# Patient Record
Sex: Male | Born: 2000 | Race: White | Hispanic: No | Marital: Single | Smoking: Current every day smoker
Health system: Southern US, Community
[De-identification: ages and names within clinical notes are randomized; demographics above are authoritative.]

---
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
## 2022-04-07 ENCOUNTER — Ambulatory Visit: Payer: Managed Care, Other (non HMO) | Admitting: Family Medicine

## 2022-05-02 ENCOUNTER — Emergency Department (HOSPITAL_BASED_OUTPATIENT_CLINIC_OR_DEPARTMENT_OTHER)
Admission: EM | Admit: 2022-05-02 | Discharge: 2022-05-02 | Disposition: A | Discharge status: Home / Self Care | Payer: BC Managed Care – PPO | Attending: Emergency Medicine | Admitting: Emergency Medicine

## 2022-05-02 ENCOUNTER — Encounter (HOSPITAL_BASED_OUTPATIENT_CLINIC_OR_DEPARTMENT_OTHER): Payer: Self-pay

## 2022-05-02 ENCOUNTER — Other Ambulatory Visit: Payer: Self-pay

## 2022-05-02 ENCOUNTER — Emergency Department (HOSPITAL_BASED_OUTPATIENT_CLINIC_OR_DEPARTMENT_OTHER): Payer: BC Managed Care – PPO | Admitting: Radiology

## 2022-05-02 DIAGNOSIS — R0789 Other chest pain: Secondary | ICD-10-CM

## 2022-05-02 DIAGNOSIS — R079 Chest pain, unspecified: Secondary | ICD-10-CM | POA: Diagnosis not present

## 2022-05-02 NOTE — Discharge Instructions (Signed)
You are worked up in the ER for chest pain.  Your x-ray did not show signs of pneumonia or collapsed lung.  We talked about the small possibility that you may have experienced a spontaneous lung collapse, which reinflated, called a pneumothorax.  I included information for you to read about regarding this.  If your pain gets worse, you have difficulty breathing or taking in a breath, you become lightheaded or feel like passing out, please return immediately to the ER.  It is also possible you are experiencing inflammation of your chest wall, which is called costochondritis.  I would recommend avoiding chest muscle exercises for the next 1 to 2 weeks.  I would also recommend taking ibuprofen 600 mg every 6-8 hours as needed for pain for the next 7 days.  Please call to schedule follow-up appointment in your doctor's office at the end of the week for another recheck of your symptoms.

## 2022-05-02 NOTE — ED Provider Notes (Signed)
[facility] EMERGENCY DEPT Provider Note   CSN: [id number] Arrival date & time: 05/02/22  2155     History  Chief Complaint  Patient presents with   Chest Pain    Jaiceon Collister is a 21 y.o. male presented ED with complaint of left-sided chest pain.  He reports that he was relaxing at the pool around 9 PM this evening when he had a sudden pain in the left side of his chest.  It is located just lateral to his nipple.  It has been constant for 2 to 3 hours, worse with movement and inspiration.  He has never had this pain before.  HPI     Home Medications Prior to Admission medications   Not on File      Allergies    Patient has no known allergies.    Review of Systems   Review of Systems  Physical Exam Updated Vital Signs BP 128/72   Pulse 63   Temp 98.4 F (36.9 C) (Oral)   Resp 15   Ht 5\' 10"  (1.778 m)   Wt 73.9 kg   SpO2 99%   BMI 23.39 kg/m  Physical Exam Constitutional:      General: He is not in acute distress. HENT:     Head: Normocephalic and atraumatic.  Eyes:     Conjunctiva/sclera: Conjunctivae normal.     Pupils: Pupils are equal, round, and reactive to light.  Cardiovascular:     Rate and Rhythm: Normal rate and regular rhythm.  Pulmonary:     Effort: Pulmonary effort is normal. No respiratory distress.  Abdominal:     General: There is no distension.     Tenderness: There is no abdominal tenderness.  Musculoskeletal:     Comments: Tenderness of the intercostal ribs lateral to the left area  Skin:    General: Skin is warm and dry.  Neurological:     General: No focal deficit present.     Mental Status: He is alert. Mental status is at baseline.  Psychiatric:        Mood and Affect: Mood normal.        Behavior: Behavior normal.     ED Results / Procedures / Treatments   Labs (all labs ordered are listed, but only abnormal results are displayed) Labs Reviewed - No data to display  EKG EKG  Interpretation  Date/Time:  Tuesday May 02 2022 22:01:53 EDT Ventricular Rate:  73 PR Interval:  156 QRS Duration: 92 QT Interval:  374 QTC Calculation: 412 R Axis:   96 Text Interpretation: Normal sinus rhythm Rightward axis Borderline ECG No previous ECGs available Confirmed by 07[birth year]1983 [phone]) on 05/02/2022 10:24:58 PM  Radiology DG Chest 2 View  Result Date: 05/02/2022 CLINICAL DATA:  Chest pain. EXAM: CHEST - 2 VIEW COMPARISON:  January 08, 2006 FINDINGS: The heart size and mediastinal contours are within normal limits. Both lungs are clear. The visualized skeletal structures are unremarkable. IMPRESSION: No active cardiopulmonary disease. Electronically Signed   By: January 10, 2006 M.D.   On: 05/02/2022 22:45    Procedures Procedures    Medications Ordered in ED Medications - No data to display  ED Course/ Medical Decision Making/ A&P                           Medical Decision Making Amount and/or Complexity of Data Reviewed Radiology: ordered.   Differential diagnosis for the patient's symptoms would include costochondritis versus  pneumothorax versus occult rib fracture versus precordial catch versus other  I personally reviewed and interpreted his EKG on arrival, which shows a normal sinus rhythm, I have a very low clinical suspicion for ACS related dissection based on his clinical presentation.  This is also not consistent with acute myocarditis or pericarditis.  Patient's mother and father present at bedside.  X-rays personally reviewed and interpreted, showing no pneumothorax or pneumonia.  I suspect his pain may be costochondritis or precordial catch, and recommended NSAIDs for the next 7 days, and avoiding chest muscle exercises, as he does lift heavy weights at the gym.  I did discuss with the patient and his parents the small possibility that this was a spontaneous pneumothorax that had resolved, or may be too small to see on x-ray imaging, but would otherwise  be stable and not require any type of surgical intervention or chest tube.  Close return precautions were discussed for worsening pain, difficulty breathing, lightheadedness, loss of consciousness.  They all verbalized understanding.        Final Clinical Impression(s) / ED Diagnoses Final diagnoses:  Chest wall pain    Rx / DC Orders ED Discharge Orders     None         Terald Sleeper, MD 05/02/22 2339

## 2022-05-02 NOTE — ED Notes (Signed)
Reviewed AVS/discharge instruction with patient. Time allotted for and all questions answered. Patient is agreeable for d/c and escorted to ed exit by staff.  

## 2022-05-02 NOTE — ED Triage Notes (Signed)
Pt presents to the ED with left sided CP that started around 2100. Denies any associated symptoms. States that he was sitting watching fireworks when he started feeling a stabbing pain in the left side of his chest. Pt A&Ox4 at time of triage. VSS. EKG obtained

## 2022-09-17 DIAGNOSIS — J00 Acute nasopharyngitis [common cold]: Secondary | ICD-10-CM | POA: Diagnosis not present

## 2022-10-06 ENCOUNTER — Encounter: Payer: Self-pay | Admitting: Family Medicine

## 2022-10-06 ENCOUNTER — Ambulatory Visit (INDEPENDENT_AMBULATORY_CARE_PROVIDER_SITE_OTHER): Payer: BC Managed Care – PPO | Admitting: Family Medicine

## 2022-10-06 VITALS — BP 114/72 | HR 68 | Temp 97.8°F | Resp 16 | Ht 70.0 in | Wt 164.5 lb

## 2022-10-06 DIAGNOSIS — M62830 Muscle spasm of back: Secondary | ICD-10-CM

## 2022-10-06 MED ORDER — METHOCARBAMOL 500 MG PO TABS: 500.0000 mg | ORAL_TABLET | Freq: Three times a day (TID) | ORAL | 0 refills | Status: DC | PRN

## 2022-10-06 MED ORDER — MELOXICAM 15 MG PO TABS: 15.0000 mg | ORAL_TABLET | Freq: Every day | ORAL | 1 refills | Status: AC

## 2022-10-06 NOTE — Progress Notes (Signed)
   Subjective:    Patient ID: Edgar Walker, male    DOB: September 08, 2001, 21 y.o.   MRN: [id number]  HPI Back pain- pt reports back spasm x3 months.  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  Told him to ice and stretch.  Some relief w/ stretching hamstrings.  Pt reports low back pain that travels up spine to mid-back.  Improvement w/ ibuprofen and warm showers.  No radiation of pain down into butt or legs.     Review of Systems For ROS see HPI     Objective:   Physical Exam Vitals reviewed.  Constitutional:      General: He is not in acute distress.    Appearance: Normal appearance. He is not ill-appearing.  HENT:     Head: Normocephalic and atraumatic.  Musculoskeletal:        General: Tenderness (TTP over lumbar paraspinal muscles bilaterally w/ obvious spasm) present.  Skin:    General: Skin is warm and dry.     Findings: No rash.  Neurological:     General: No focal deficit present.     Mental Status: He is alert and oriented to person, place, and time.     Cranial Nerves: No cranial nerve deficit.     Sensory: No sensory deficit.     Motor: No weakness.     Gait: Gait normal.           Assessment & Plan:   Lumbar paraspinal muscle spasm- new.  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  Reports back pain and spasm x3 months.  Trainer at school suggested stretching hamstrings and ice baths.  Some improvement w/ Advil, massage, hot showers.  Palpable spasm on exam.  Start scheduled Meloxicam, add Methocarbamol prn, and refer to PT for evaluation, tx, and HEP.  Pt expressed understanding and is in agreement w/ plan.

## 2022-10-06 NOTE — Patient Instructions (Signed)
Follow up as needed or as scheduled START the once daily Meloxicam for pain and inflammation- take w/ food AVOID ibuprofen, Advil, Aleve while on the Meloxicam USE the Methocarbamol as needed for spasms- may cause drowsiness HEAT!!! We'll call you to set up your physical therapy appt Massage if possible Call with any questions or concerns Hang in there!!!

## 2022-10-12 DIAGNOSIS — M5451 Vertebrogenic low back pain: Secondary | ICD-10-CM | POA: Diagnosis not present

## 2022-10-12 DIAGNOSIS — M6281 Muscle weakness (generalized): Secondary | ICD-10-CM | POA: Diagnosis not present

## 2022-10-16 ENCOUNTER — Telehealth: Payer: Self-pay

## 2022-10-16 NOTE — Telephone Encounter (Signed)
Received form from [facility]  Placed in Dr Beverely Low to be signed folder

## 2022-10-17 NOTE — Telephone Encounter (Signed)
Forms faxed and placed in scan  

## 2022-10-17 NOTE — Telephone Encounter (Signed)
Form completed and returned to [facility] 

## 2022-10-18 DIAGNOSIS — M5451 Vertebrogenic low back pain: Secondary | ICD-10-CM | POA: Diagnosis not present

## 2022-10-18 DIAGNOSIS — M6281 Muscle weakness (generalized): Secondary | ICD-10-CM | POA: Diagnosis not present

## 2022-10-20 DIAGNOSIS — M5451 Vertebrogenic low back pain: Secondary | ICD-10-CM | POA: Diagnosis not present

## 2022-10-20 DIAGNOSIS — M6281 Muscle weakness (generalized): Secondary | ICD-10-CM | POA: Diagnosis not present

## 2022-10-27 DIAGNOSIS — M6281 Muscle weakness (generalized): Secondary | ICD-10-CM | POA: Diagnosis not present

## 2022-10-27 DIAGNOSIS — M5451 Vertebrogenic low back pain: Secondary | ICD-10-CM | POA: Diagnosis not present

## 2022-11-02 ENCOUNTER — Encounter: Payer: Managed Care, Other (non HMO) | Admitting: Family Medicine

## 2022-11-05 DIAGNOSIS — L03019 Cellulitis of unspecified finger: Secondary | ICD-10-CM | POA: Diagnosis not present

## 2022-11-08 DIAGNOSIS — M5451 Vertebrogenic low back pain: Secondary | ICD-10-CM | POA: Diagnosis not present

## 2022-11-08 DIAGNOSIS — M6281 Muscle weakness (generalized): Secondary | ICD-10-CM | POA: Diagnosis not present

## 2022-11-09 ENCOUNTER — Telehealth: Payer: Self-pay

## 2022-11-09 NOTE — Telephone Encounter (Signed)
[facility] has sent discharge papers to be signed for pt . Placed in Dr Birdie Riddle to be signed folder

## 2022-11-13 NOTE — Telephone Encounter (Signed)
This was completed on Friday and returned to [facility]

## 2022-11-13 NOTE — Telephone Encounter (Signed)
Forms faxed and placed in scan  

## 2022-12-19 DIAGNOSIS — J029 Acute pharyngitis, unspecified: Secondary | ICD-10-CM | POA: Diagnosis not present

## 2022-12-22 DIAGNOSIS — R2991 Unspecified symptoms and signs involving the musculoskeletal system: Secondary | ICD-10-CM | POA: Diagnosis not present

## 2022-12-25 ENCOUNTER — Telehealth: Payer: Self-pay | Admitting: Family Medicine

## 2022-12-25 MED ORDER — METHOCARBAMOL 500 MG PO TABS: 500.0000 mg | ORAL_TABLET | Freq: Three times a day (TID) | ORAL | 0 refills | Status: AC | PRN

## 2022-12-25 NOTE — Telephone Encounter (Signed)
Encourage patient to contact the pharmacy for refills or they can request refills through [facility] TO: [facility] [address] [place] [address]  MEDICATION NAME & DOSE:methocarbamol (ROBAXIN) 500 MG tablet   NOTES/COMMENTS FROM PATIENT:

## 2022-12-25 NOTE — Telephone Encounter (Signed)
Prescription sent to requested pharmacy in [place]

## 2022-12-25 NOTE — Telephone Encounter (Signed)
Pt informed

## 2023-01-05 ENCOUNTER — Telehealth: Payer: Self-pay

## 2023-01-05 NOTE — Telephone Encounter (Signed)
Spoke to patient to get him scheduled for his CPE. He said that he would call and schedule over the summer when he is here from college.

## 2023-01-08 DIAGNOSIS — S61302A Unspecified open wound of right middle finger with damage to nail, initial encounter: Secondary | ICD-10-CM | POA: Diagnosis not present

## 2023-03-14 ENCOUNTER — Ambulatory Visit (INDEPENDENT_AMBULATORY_CARE_PROVIDER_SITE_OTHER): Payer: BC Managed Care – PPO | Admitting: Family Medicine

## 2023-03-14 ENCOUNTER — Encounter: Payer: Self-pay | Admitting: Family Medicine

## 2023-03-14 VITALS — BP 124/72 | HR 96 | Temp 97.9°F | Resp 18 | Ht 70.0 in | Wt 155.5 lb

## 2023-03-14 DIAGNOSIS — M25551 Pain in right hip: Secondary | ICD-10-CM

## 2023-03-14 NOTE — Progress Notes (Signed)
   Subjective:    Patient ID: Edgar Walker, male    DOB: 2001/03/12, 22 y.o.   MRN: [id number]  HPI Hip pain- R sided, sxs started 3-4 weeks ago.  No known injury.  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  Some groin pain when sitting or lying down.  Pain worsens w/ weightbearing and walking.  Some relief w/ Advil.  Gets STIM from athletic trainer to try and loosen muscles around it.     Review of Systems For ROS see HPI     Objective:   Physical Exam Vitals reviewed.  Constitutional:      General: He is not in acute distress.    Appearance: Normal appearance. He is not ill-appearing.  HENT:     Head: Normocephalic and atraumatic.  Musculoskeletal:     Comments: R groin pain  Skin:    General: Skin is warm and dry.  Neurological:     General: No focal deficit present.     Mental Status: He is alert and oriented to person, place, and time.     Gait: Gait abnormal (antalgic gait).  Psychiatric:        Mood and Affect: Mood normal.        Behavior: Behavior normal.        Thought Content: Thought content normal.           Assessment & Plan:  R hip pain- new.  Pt having true hip pain as it is located in his groin.  Rather than get xrays and then refer, will send to Ortho immediately for evaluation and tx.  Pt expressed understanding and is in agreement w/ plan.

## 2023-03-14 NOTE — Patient Instructions (Addendum)
Follow up as needed or as scheduled Go to [facility] [address] (right next to [employer]) They have walk in appts/evaluations Call with any questions or concerns Hang in there!!!

## 2023-06-27 DIAGNOSIS — Z025 Encounter for examination for participation in sport: Secondary | ICD-10-CM | POA: Diagnosis not present

## 2023-07-24 DIAGNOSIS — L03019 Cellulitis of unspecified finger: Secondary | ICD-10-CM | POA: Diagnosis not present

## 2023-09-11 DIAGNOSIS — L0390 Cellulitis, unspecified: Secondary | ICD-10-CM | POA: Diagnosis not present

## 2023-09-13 DIAGNOSIS — L03019 Cellulitis of unspecified finger: Secondary | ICD-10-CM | POA: Diagnosis not present

## 2023-11-20 DIAGNOSIS — Z130 Encounter for screening for diseases of the blood and blood-forming organs and certain disorders involving the immune mechanism: Secondary | ICD-10-CM | POA: Diagnosis not present

## 2024-01-22 DIAGNOSIS — Y9365 Activity, lacrosse and field hockey: Secondary | ICD-10-CM | POA: Diagnosis not present

## 2024-01-22 DIAGNOSIS — S39012A Strain of muscle, fascia and tendon of lower back, initial encounter: Secondary | ICD-10-CM | POA: Diagnosis not present

## 2024-01-22 DIAGNOSIS — Z0130 Encounter for examination of blood pressure without abnormal findings: Secondary | ICD-10-CM | POA: Diagnosis not present

## 2024-06-24 DIAGNOSIS — Z025 Encounter for examination for participation in sport: Secondary | ICD-10-CM | POA: Diagnosis not present

## 2024-06-24 DIAGNOSIS — Z0130 Encounter for examination of blood pressure without abnormal findings: Secondary | ICD-10-CM | POA: Diagnosis not present

## 2024-09-05 DIAGNOSIS — Z0130 Encounter for examination of blood pressure without abnormal findings: Secondary | ICD-10-CM | POA: Diagnosis not present

## 2024-09-05 DIAGNOSIS — Y9365 Activity, lacrosse and field hockey: Secondary | ICD-10-CM | POA: Diagnosis not present

## 2024-09-05 DIAGNOSIS — S5002XA Contusion of left elbow, initial encounter: Secondary | ICD-10-CM | POA: Diagnosis not present

## 2024-09-05 DIAGNOSIS — M25522 Pain in left elbow: Secondary | ICD-10-CM | POA: Diagnosis not present

## 2024-09-05 DIAGNOSIS — S59902A Unspecified injury of left elbow, initial encounter: Secondary | ICD-10-CM | POA: Diagnosis not present

## 2024-09-05 DIAGNOSIS — X58XXXA Exposure to other specified factors, initial encounter: Secondary | ICD-10-CM | POA: Diagnosis not present

## 2024-09-06 DIAGNOSIS — Z0130 Encounter for examination of blood pressure without abnormal findings: Secondary | ICD-10-CM | POA: Diagnosis not present

## 2024-09-06 DIAGNOSIS — S5002XA Contusion of left elbow, initial encounter: Secondary | ICD-10-CM | POA: Diagnosis not present

## 2024-09-24 DIAGNOSIS — H11153 Pinguecula, bilateral: Secondary | ICD-10-CM | POA: Diagnosis not present

## 2024-09-24 DIAGNOSIS — H52203 Unspecified astigmatism, bilateral: Secondary | ICD-10-CM | POA: Diagnosis not present

## 2024-09-24 DIAGNOSIS — H5203 Hypermetropia, bilateral: Secondary | ICD-10-CM | POA: Diagnosis not present

## 2024-09-24 DIAGNOSIS — H1013 Acute atopic conjunctivitis, bilateral: Secondary | ICD-10-CM | POA: Diagnosis not present
